# Patient Record
Sex: Male | Born: 1995 | Race: White | Hispanic: No | Marital: Single | Smoking: Never smoker
Health system: Southern US, Community
[De-identification: ages and names within clinical notes are randomized; demographics above are authoritative.]

---
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
## 2018-05-11 ENCOUNTER — Emergency Department (HOSPITAL_BASED_OUTPATIENT_CLINIC_OR_DEPARTMENT_OTHER)
Admission: EM | Admit: 2018-05-11 | Discharge: 2018-05-11 | Disposition: A | Discharge status: Home / Self Care | Payer: Self-pay | Attending: Emergency Medicine | Admitting: Emergency Medicine

## 2018-05-11 ENCOUNTER — Other Ambulatory Visit: Payer: Self-pay

## 2018-05-11 ENCOUNTER — Encounter (HOSPITAL_BASED_OUTPATIENT_CLINIC_OR_DEPARTMENT_OTHER): Payer: Self-pay | Admitting: *Deleted

## 2018-05-11 ENCOUNTER — Emergency Department (HOSPITAL_BASED_OUTPATIENT_CLINIC_OR_DEPARTMENT_OTHER): Payer: Self-pay

## 2018-05-11 DIAGNOSIS — G8929 Other chronic pain: Secondary | ICD-10-CM

## 2018-05-11 DIAGNOSIS — M25562 Pain in left knee: Secondary | ICD-10-CM | POA: Insufficient documentation

## 2018-05-11 NOTE — ED Provider Notes (Signed)
Medical screening examination/treatment/procedure(s) were conducted as a shared visit with non-physician practitioner(s) and myself.  I personally evaluated the patient during the encounter.  None  Left knee pain. Sports medicine follow up, xray negative   Azalia Bilis, MD 05/13/18 [phone]

## 2018-05-11 NOTE — Discharge Instructions (Addendum)
Please follow up with the sports medicine physician

## 2018-05-11 NOTE — ED Triage Notes (Signed)
Pt c/o left knee pain w/o injury x 1 week

## 2018-05-11 NOTE — ED Provider Notes (Signed)
[facility] EMERGENCY DEPARTMENT Provider Note   CSN: [id number] Arrival date & time: 05/11/18  1429    History   Chief Complaint Chief Complaint  Patient presents with  . Knee Pain    HPI Hendrik Kor is a 23 y.o. male.     HPI  Patient is a 23 year old male with no significant past medical history presenting for left knee pain.  He reports that initially began 1 year ago after he had a twisting-rotational injury.  He reports that since then he has had recurring and remitting pain as well as a "bulge" overlying the left lateral knee.  Any swelling or erythema.  Denies any recent increased trauma, just increased pain of it.  Denies any distal weakness or numbness.  History reviewed. No pertinent past medical history.  There are no active problems to display for this patient.   History reviewed. No pertinent surgical history.      Home Medications    Prior to Admission medications   Not on File    Family History History reviewed. No pertinent family history.  Social History Social History   Tobacco Use  . Smoking status: Never Smoker  . Smokeless tobacco: Never Used  Substance Use Topics  . Alcohol use: Not Currently  . Drug use: Not Currently     Allergies   Patient has no known allergies.   Review of Systems Review of Systems  Musculoskeletal: Positive for arthralgias and joint swelling.  Skin: Negative for color change.  Neurological: Negative for weakness and numbness.     Physical Exam Updated Vital Signs BP (!) 157/77   Pulse 63   Temp 97.8 F (36.6 C) (Oral)   Resp 16   Ht 5\' 10"  (1.778 m)   Wt 79.4 kg   SpO2 100%   BMI 25.11 kg/m   Physical Exam Vitals signs and nursing note reviewed.  Constitutional:      General: He is not in acute distress.    Appearance: He is well-developed. He is not diaphoretic.     Comments: Sitting comfortably in bed.  HENT:     Head: Normocephalic and atraumatic.  Eyes:   General:        Right eye: No discharge.        Left eye: No discharge.     Conjunctiva/sclera: Conjunctivae normal.     Comments: EOMs normal to gross examination.  Neck:     Musculoskeletal: Normal range of motion.  Cardiovascular:     Rate and Rhythm: Normal rate and regular rhythm.     Comments: Intact, 2+ left DP pulse. Pulmonary:     Comments: Patient converses comfortably without audible wheeze or stridor. Abdominal:     General: There is no distension.  Musculoskeletal: Normal range of motion.     Comments: Left knee with tenderness to palpation of lateral collateral ligament. Full ROM. No joint line tenderness. No joint effusion or swelling appreciated. Palpable swelling over LCL. No abnormal alignment or patellar mobility. No bruising, erythema or warmth overlaying the joint. No varus/valgus laxity. Negative drawer's, Lachman's and McMurray's.  No crepitus.  2+ DP pulses bilaterally. All compartments are soft. Sensation intact distal to injury.  Skin:    General: Skin is warm and dry.  Neurological:     Mental Status: He is alert.     Comments: Cranial nerves intact to gross observation. Patient moves extremities without difficulty.  Psychiatric:        Behavior: Behavior normal.  Thought Content: Thought content normal.        Judgment: Judgment normal.      ED Treatments / Results  Labs (all labs ordered are listed, but only abnormal results are displayed) Labs Reviewed - No data to display  EKG None  Radiology No results found.  Procedures Procedures (including critical care time)  Medications Ordered in ED Medications - No data to display   Initial Impression / Assessment and Plan / ED Course  I have reviewed the triage vital signs and the nursing notes.  Pertinent labs & imaging results that were available during my care of the patient were reviewed by me and considered in my medical decision making (see chart for details).        Patient  well-appearing and neurovascularly intact in the left lower extremity.  Patient has a palpable and firm swelling overlying the lateral collateral ligament.  This does not show up on x-ray.  Therefore, doubt bony tumor.  Suspect that this is an overlying growth from an LCL injury.  Recommended sports medicine follow-up for MRI and evaluation of this.  Patient to return precautions for any increasing pain, development of swelling, erythema, or new or worsening symptoms.  Patient is in understanding and agrees with the plan of care.  Final Clinical Impressions(s) / ED Diagnoses   Final diagnoses:  Chronic pain of left knee    ED Discharge Orders    None       Delia Chimes 05/12/18 0115    Azalia Bilis, MD 05/12/18 1719

## 2018-05-11 NOTE — ED Notes (Signed)
Patient transported to X-ray 

## 2018-05-18 ENCOUNTER — Ambulatory Visit (INDEPENDENT_AMBULATORY_CARE_PROVIDER_SITE_OTHER): Payer: Self-pay | Admitting: Family Medicine

## 2018-05-18 VITALS — BP 134/73 | HR 50 | Ht 70.0 in | Wt 175.0 lb

## 2018-05-18 DIAGNOSIS — G8929 Other chronic pain: Secondary | ICD-10-CM

## 2018-05-18 DIAGNOSIS — M25562 Pain in left knee: Secondary | ICD-10-CM

## 2018-05-18 NOTE — Patient Instructions (Signed)
You have a lateral meniscus tear with a parameniscal cyst. We will go ahead with an MRI of your knee to characterize this further and I will contact you with those results. Icing 15 minutes 3-4 times a day as needed. Aleve or ibuprofen for pain and inflammation. Compression sleeve may be helpful as well. Straight leg raises, knee extensions 3 sets of 10 once a day - add ankle weight if these are too easy.

## 2018-05-19 ENCOUNTER — Encounter: Payer: Self-pay | Admitting: Family Medicine

## 2018-05-19 NOTE — Progress Notes (Signed)
PCP: Patient, No Pcp Per  Subjective:   HPI: Patient is a 23 y.o. male here for knee pain.  Patient reports about 1 year ago he went to cross his legs when he brought the left leg over he felt a pop in the lateral aspect of his left knee. The day afterwards he states that it was hard to walk and he subsequently developed swelling locally in the lateral aspect of the left knee joint. Over time he is tried an Ace wrap, relative rest. He has not tried any medications or physical therapy. When he went to the emergency department recently was a first time he sought care for this. Pain gets up to a 7 or 8 out of 10 and last week he could barely walk Pain is worse if he tries to exercise and when walking around campus. Feels like it catches and gets stuck. No giving out, numbness, skin changes.  History reviewed. No pertinent past medical history.  No current outpatient medications on file prior to visit.   No current facility-administered medications on file prior to visit.     History reviewed. No pertinent surgical history.  No Known Allergies  Social History   Socioeconomic History  . Marital status: Single    Spouse name: Not on file  . Number of children: Not on file  . Years of education: Not on file  . Highest education level: Not on file  Occupational History  . Not on file  Social Needs  . Financial resource strain: Not on file  . Food insecurity:    Worry: Not on file    Inability: Not on file  . Transportation needs:    Medical: Not on file    Non-medical: Not on file  Tobacco Use  . Smoking status: Never Smoker  . Smokeless tobacco: Never Used  Substance and Sexual Activity  . Alcohol use: Not Currently  . Drug use: Not Currently  . Sexual activity: Not on file  Lifestyle  . Physical activity:    Days per week: Not on file    Minutes per session: Not on file  . Stress: Not on file  Relationships  . Social connections:    Talks on phone: Not on file    Gets together: Not on file    Attends religious service: Not on file    Active member of club or organization: Not on file    Attends meetings of clubs or organizations: Not on file    Relationship status: Not on file  . Intimate partner violence:    Fear of current or ex partner: Not on file    Emotionally abused: Not on file    Physically abused: Not on file    Forced sexual activity: Not on file  Other Topics Concern  . Not on file  Social History Narrative  . Not on file    History reviewed. No pertinent family history.  There were no vitals taken for this visit.  Review of Systems: See HPI above.     Objective:  Physical Exam:  Gen: NAD, comfortable in exam room  Left knee: Localized swelling lateral joint line.  No bruising, other deformity. TTP lateral joint line.  No other tenderness. ROM 0 - 120 degrees.  5/5 strength flexion and extension. Negative ant/post drawers. Negative valgus/varus testing. Negative lachmans. Positive mcmurrays, apleys, thessalys.  Negative patellar apprehension. NV intact distally.  Right knee: No deformity. FROM with 5/5 strength. No tenderness to palpation. NVI distally.  MSK u/s left knee: Complex multiseptated para meniscal cyst noted.  LCL appears intact throughout.  Very small effusion noted.  Assessment & Plan:  1.  Left knee pain: Consistent with lateral meniscus tear and para meniscal cyst.  Unfortunately over the past year he has not improved.  He is also getting catching of this knee which is concerning.  We will go ahead with an MRI of the knee to further characterize this and likely refer to orthopedics to discuss arthroscopy.  In the meantime compression sleeve, icing, Aleve or ibuprofen.  Reviewed home exercises to do daily as well.

## 2020-07-07 IMAGING — CR DG KNEE COMPLETE 4+V*L*
4 series · 4 of 4 positions shown · non-contrast
Comparison: None.

CLINICAL DATA: Left knee pain for 1 year which worsened over the
past week. No known injury.

EXAM:
LEFT KNEE - COMPLETE 4+ VIEW

[t knee ap left]
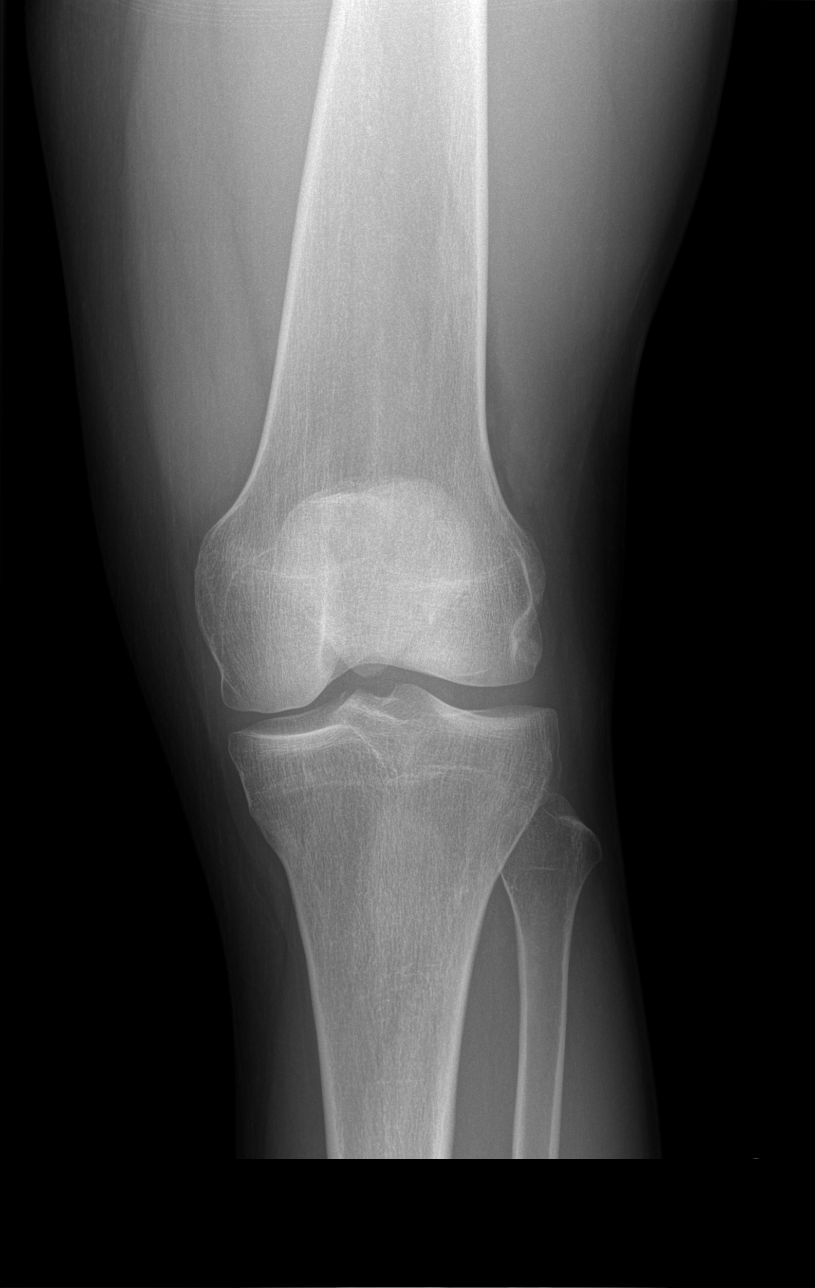

[t knee oblique left (1 of 2)]
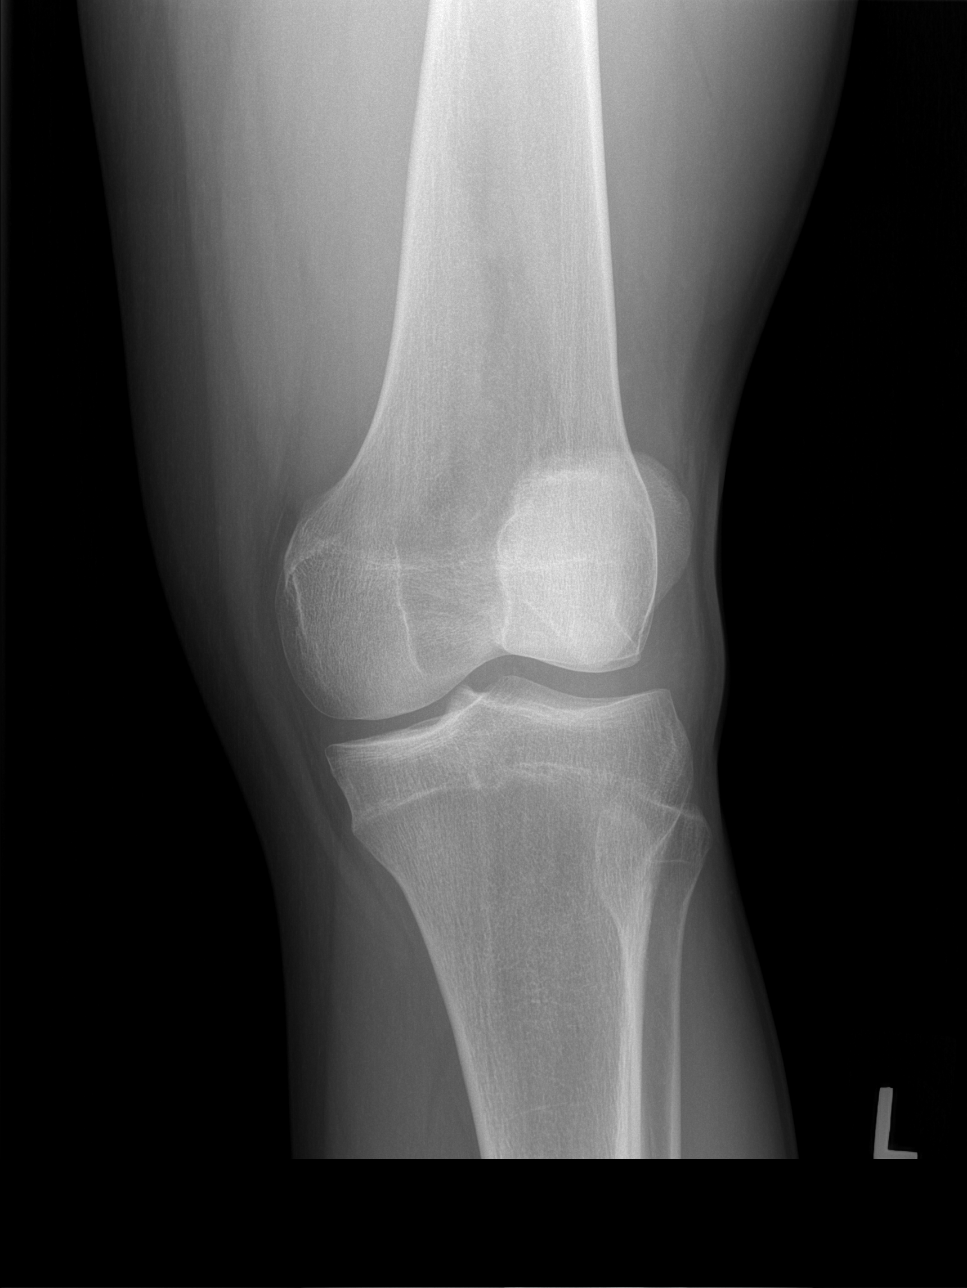

[t knee oblique left (2 of 2)]
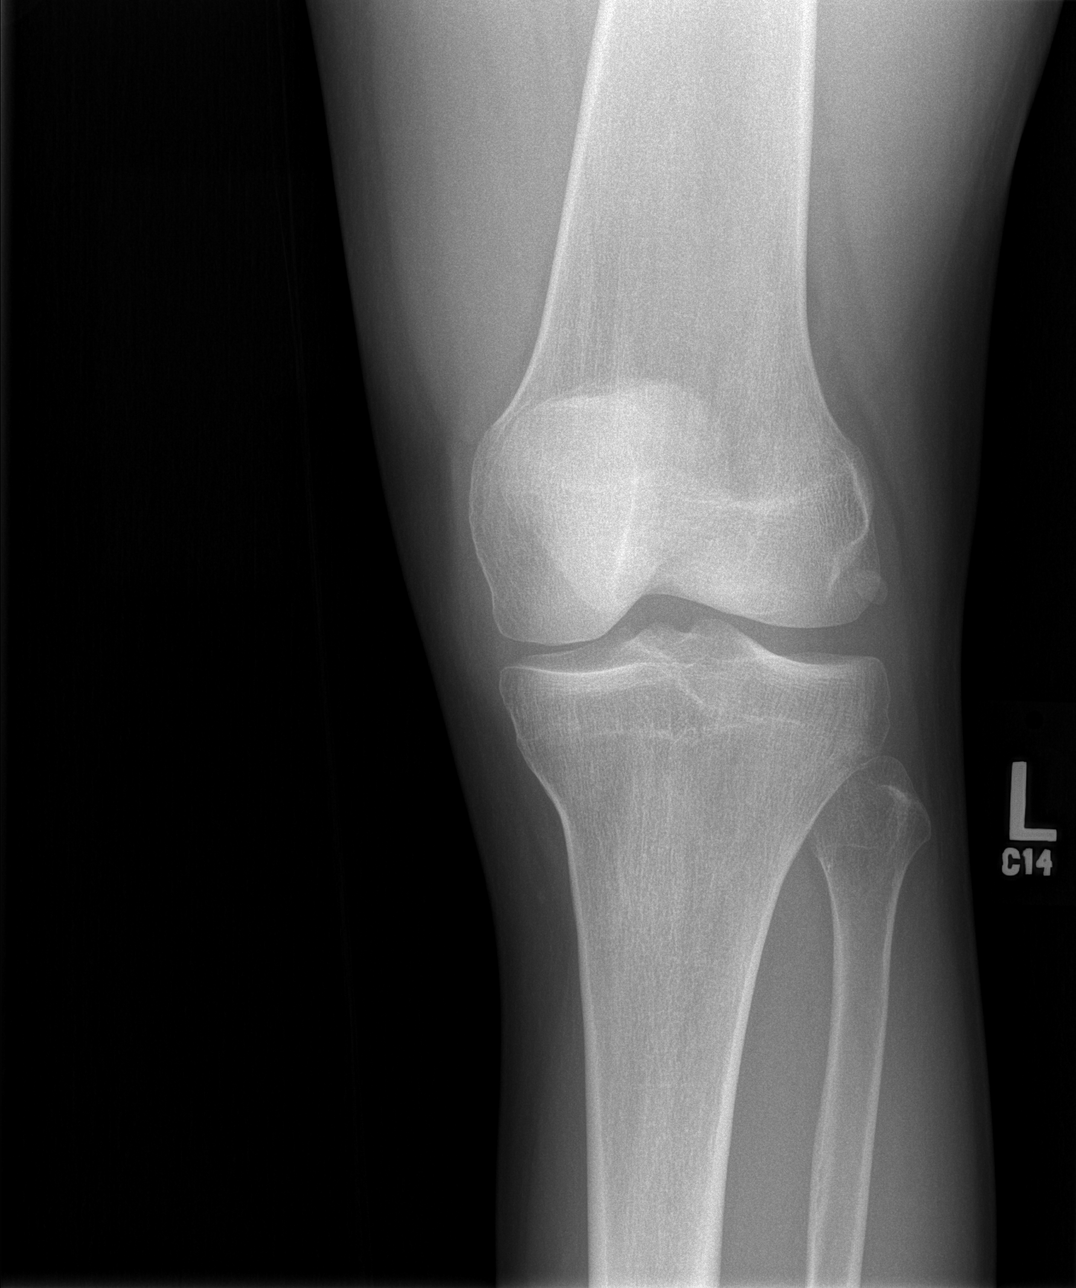

[t knee lat left]
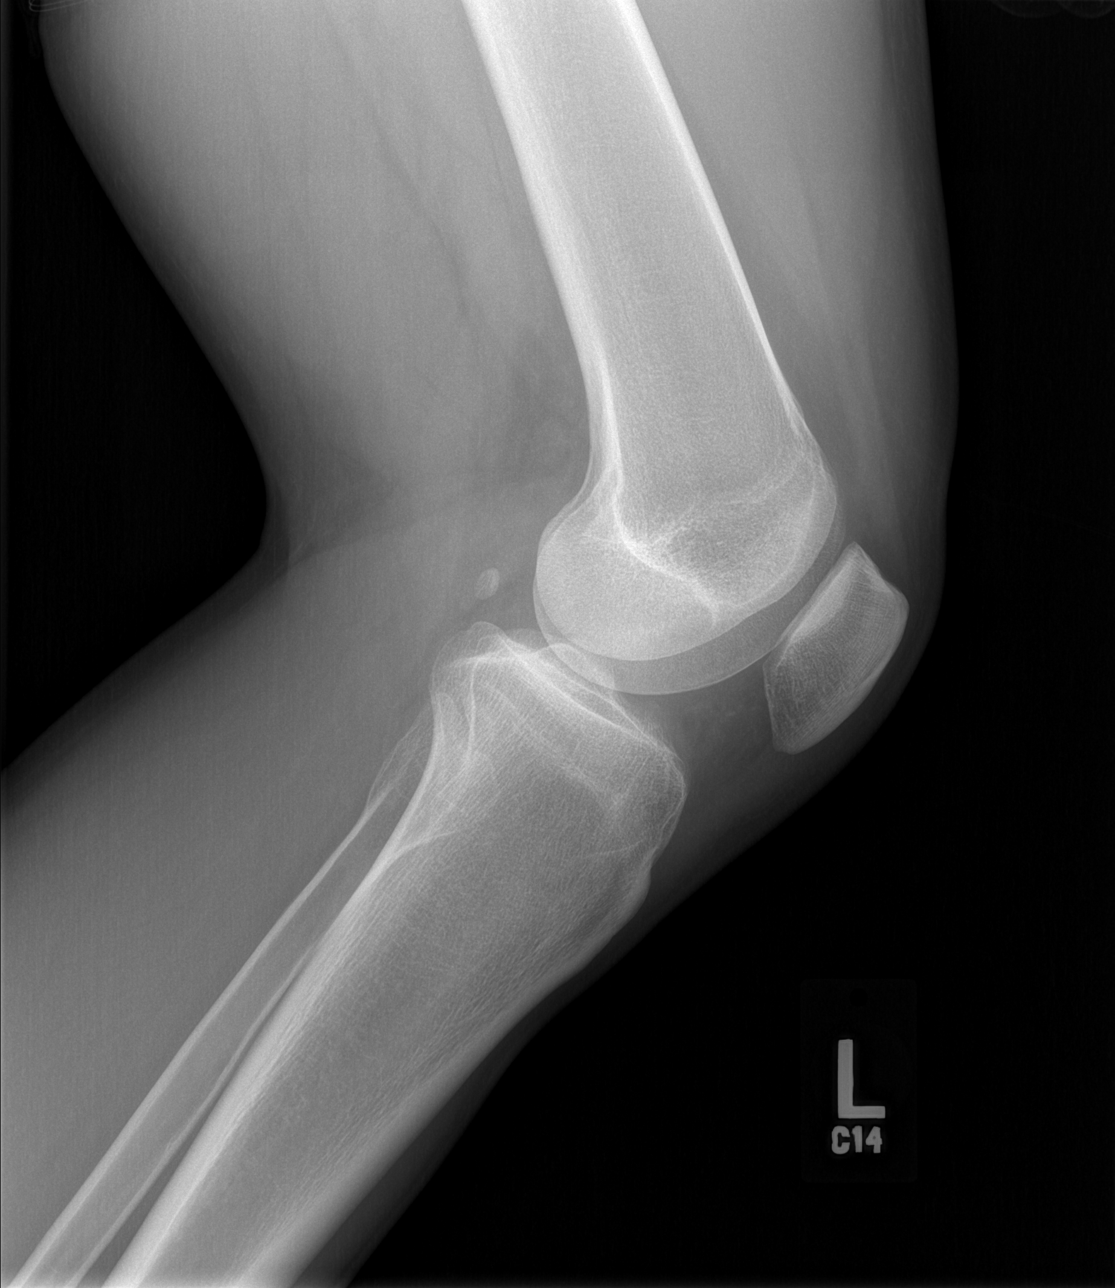

[4 of 4 positions shown; findings below may reference images not displayed]

FINDINGS: No evidence of fracture, dislocation, or joint effusion. No evidence
of arthropathy or other focal bone abnormality. Soft tissues are
unremarkable.
IMPRESSION: Normal exam.
